# Patient Record
Sex: Female | Born: 1945 | Race: White | Hispanic: No | Marital: Married | Smoking: Never smoker
Health system: Southern US, Community
[De-identification: ages and names within clinical notes are randomized; demographics above are authoritative.]

---

## 2004-02-21 ENCOUNTER — Encounter: Admission: RE | Admit: 2004-02-21 | Discharge: 2004-02-21 | Payer: Self-pay | Admitting: Family Medicine

## 2004-12-08 IMAGING — US US ABDOMEN COMPLETE
1 series · 14 of 25 positions shown · non-contrast
Comparison: none

CLINICAL DATA: Mid epigastric and lower abdominal pain.  Back pain.  Nausea.
 ABDOMINAL ULTRASOUND
 There is no evidence of gallstones or gallbladder wall thickening. There is no evidence of biliary ductal dilatation. The liver is within normal limits in echogenicity and no focal parenchymal lesions are identified. The visualized portion of the pancreas is unremarkable in appearance.

[Series 1: unknown · 0.27mm/px · 14 of 59 slices shown]
[im 1/59]
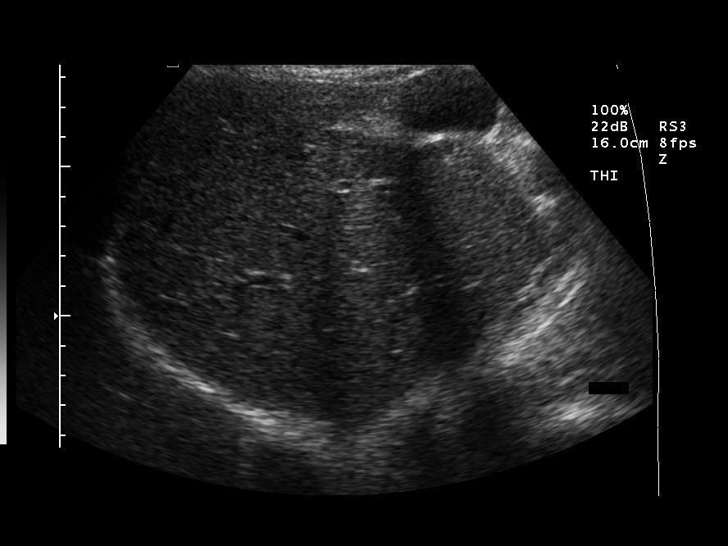
[im 5/59]
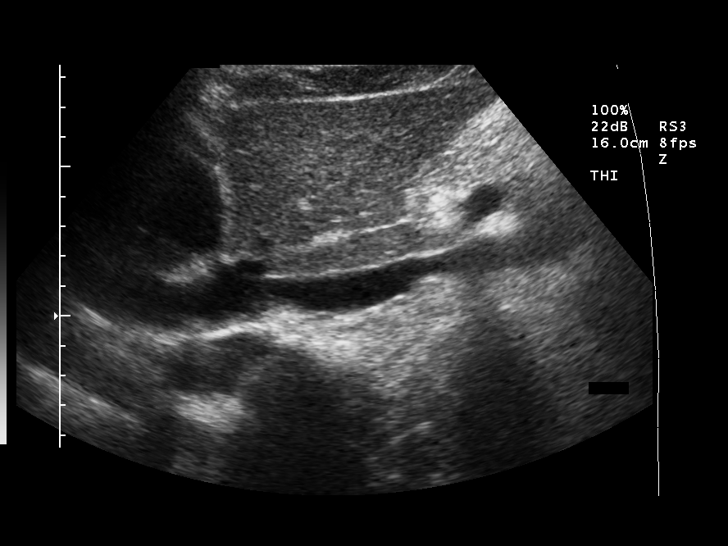
[im 10/59]
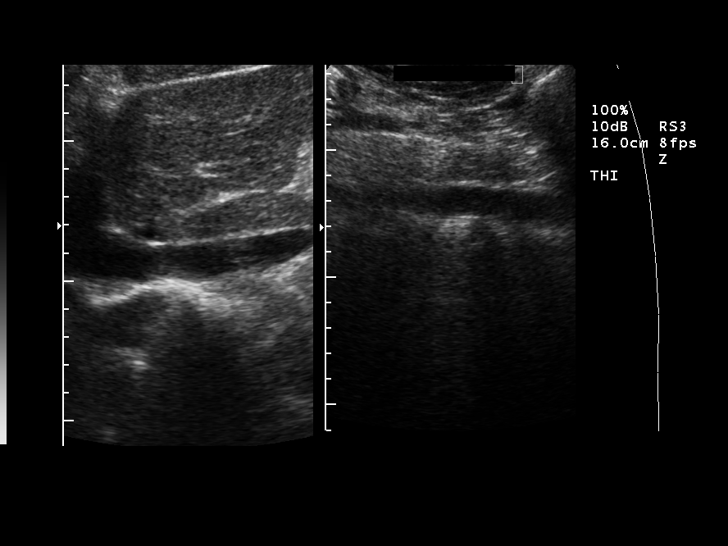
[im 15/59]
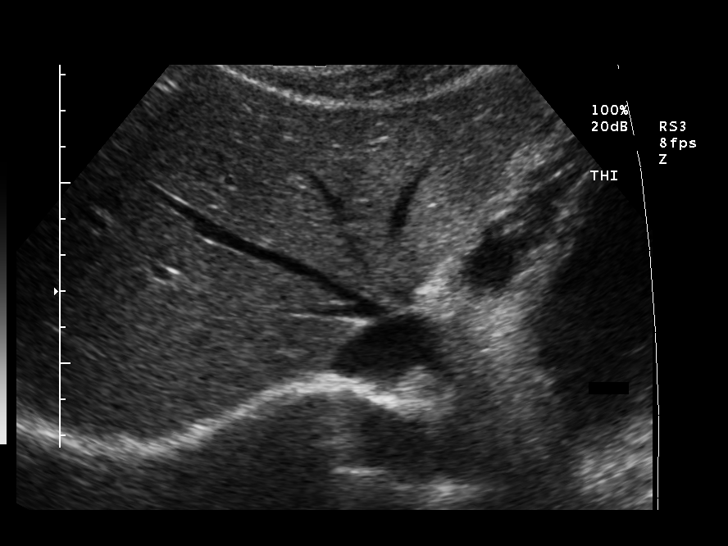
[im 20/59]
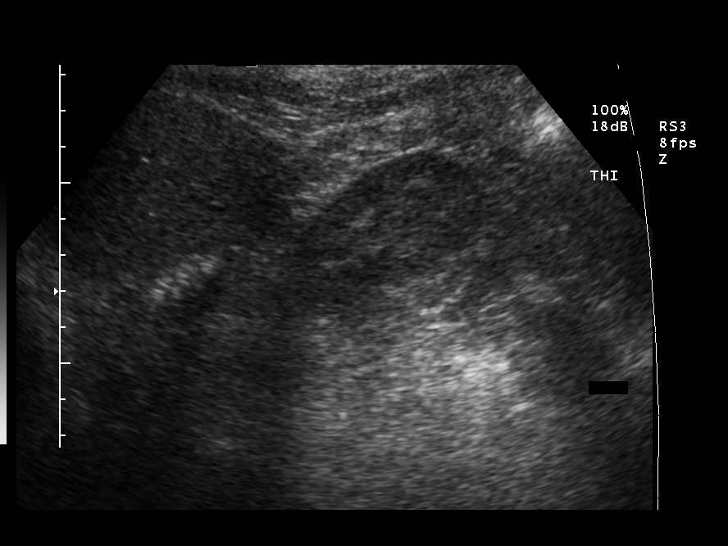
[im 22/59]
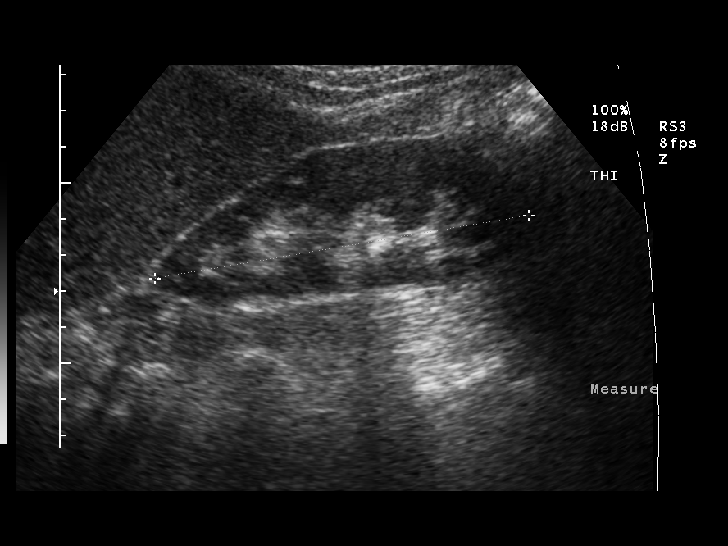
[im 27/59]
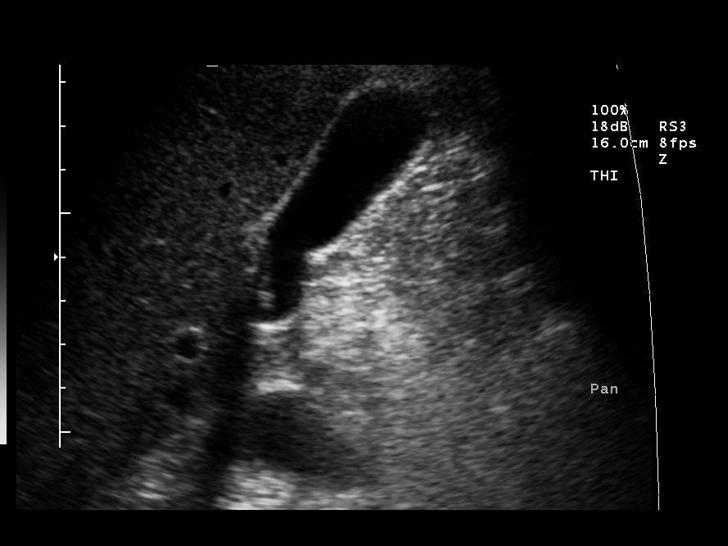
[im 32/59]
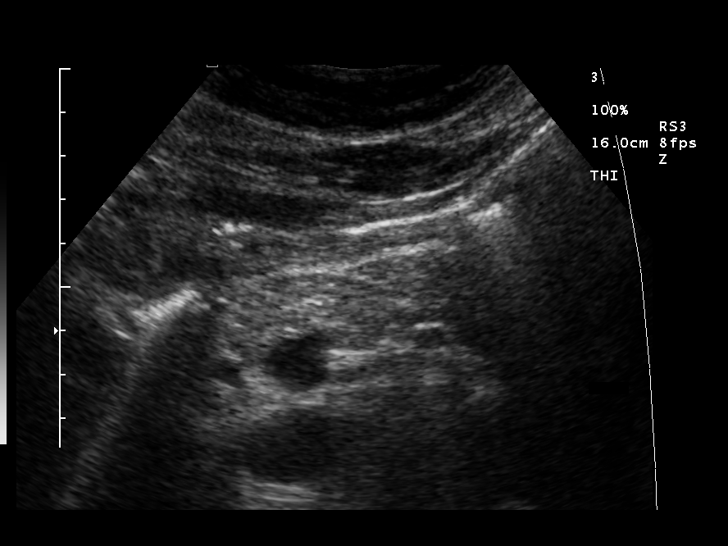
[im 37/59]
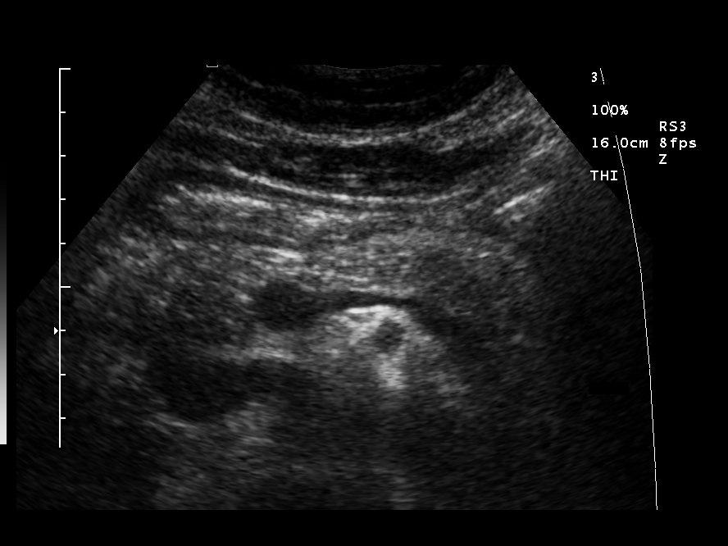
[im 39/59]
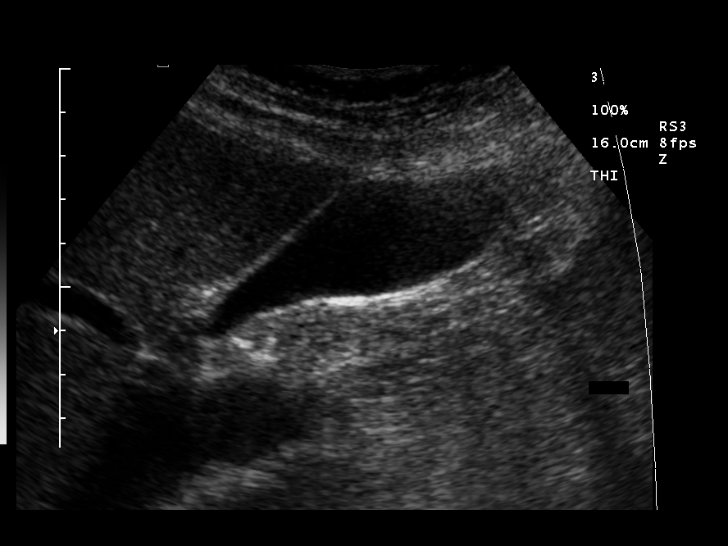
[im 44/59]
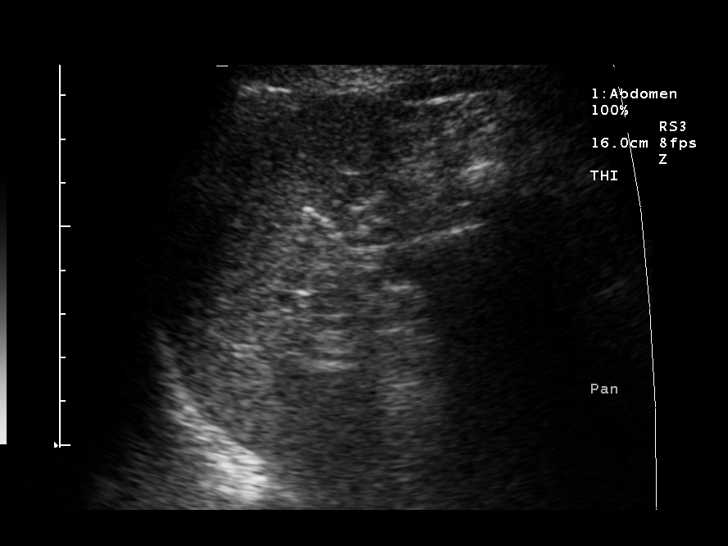
[im 49/59]
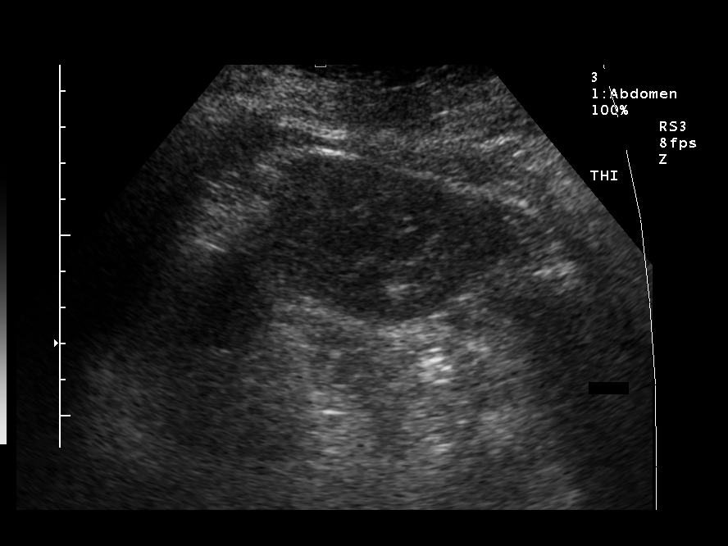
[im 54/59]
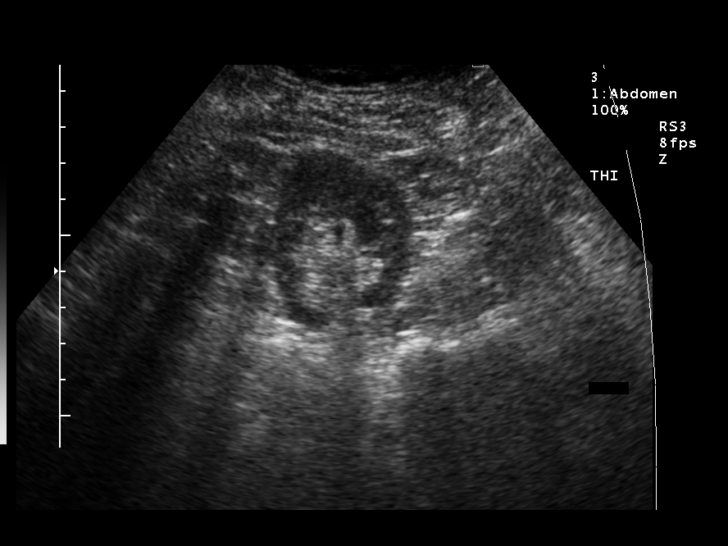
[im 59/59]
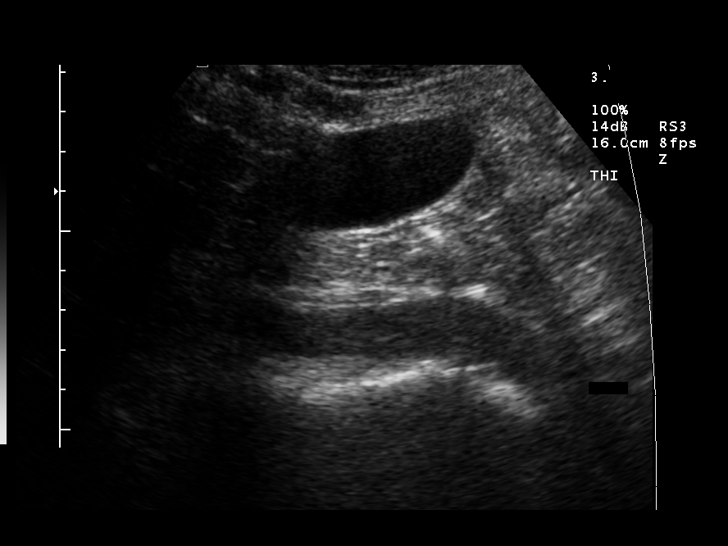

[14 of 25 positions shown; findings below may reference images not displayed]

The kidneys are within normal limits in size and echogenicity and there is no evidence of masses or hydronephrosis.  There is no evidence of splenomegaly or abdominal aortic aneurysm.  Images of the inferior vena cava are unremarkable, and there is no evidence of ascites.

 IMPRESSION
 Negative abdominal ultrasound.

## 2006-04-19 ENCOUNTER — Other Ambulatory Visit: Admission: RE | Admit: 2006-04-19 | Discharge: 2006-04-19 | Payer: Self-pay | Admitting: Obstetrics and Gynecology

## 2014-10-01 DIAGNOSIS — L820 Inflamed seborrheic keratosis: Secondary | ICD-10-CM | POA: Diagnosis not present

## 2014-10-01 DIAGNOSIS — D485 Neoplasm of uncertain behavior of skin: Secondary | ICD-10-CM | POA: Diagnosis not present

## 2014-10-01 DIAGNOSIS — L570 Actinic keratosis: Secondary | ICD-10-CM | POA: Diagnosis not present

## 2014-12-13 DIAGNOSIS — Z1231 Encounter for screening mammogram for malignant neoplasm of breast: Secondary | ICD-10-CM | POA: Diagnosis not present

## 2015-01-07 DIAGNOSIS — L905 Scar conditions and fibrosis of skin: Secondary | ICD-10-CM | POA: Diagnosis not present

## 2015-01-07 DIAGNOSIS — M810 Age-related osteoporosis without current pathological fracture: Secondary | ICD-10-CM | POA: Diagnosis not present

## 2015-02-12 DIAGNOSIS — E780 Pure hypercholesterolemia: Secondary | ICD-10-CM | POA: Diagnosis not present

## 2015-02-12 DIAGNOSIS — M810 Age-related osteoporosis without current pathological fracture: Secondary | ICD-10-CM | POA: Diagnosis not present

## 2015-02-12 DIAGNOSIS — M545 Low back pain: Secondary | ICD-10-CM | POA: Diagnosis not present

## 2015-02-12 DIAGNOSIS — M25552 Pain in left hip: Secondary | ICD-10-CM | POA: Diagnosis not present

## 2015-02-12 DIAGNOSIS — M25551 Pain in right hip: Secondary | ICD-10-CM | POA: Diagnosis not present

## 2015-03-03 ENCOUNTER — Encounter: Payer: Self-pay | Admitting: Family Medicine

## 2015-03-03 ENCOUNTER — Ambulatory Visit (INDEPENDENT_AMBULATORY_CARE_PROVIDER_SITE_OTHER): Payer: Medicare Other | Admitting: Family Medicine

## 2015-03-03 ENCOUNTER — Encounter (INDEPENDENT_AMBULATORY_CARE_PROVIDER_SITE_OTHER): Payer: Self-pay

## 2015-03-03 VITALS — BP 145/93 | Ht 66.0 in | Wt 155.0 lb

## 2015-03-03 DIAGNOSIS — M25551 Pain in right hip: Secondary | ICD-10-CM

## 2015-03-03 NOTE — Progress Notes (Signed)
  Maria Beck - 69 y.o. female MRN [id number]  Date of birth: Jun 28, 1946   Maria Beck is a 69 y.o. female who presents today for bilateral hip pain with more significant right hip pain. The pain has been occuring for a few months. The right hip pain is worse in the morning when she is trying to stand up.  The pain is occuring in the right groin and the anterior quad. The pain is sharp in nature. She denies any prior injury or fall to her right hip. She denies any prior surgery. She denies any paresthesia, fever, chills or night sweats. She is not currently taking anything for pain. She has a history of a broken disc in her back for which she has done physical therapy for.  PMHx - reviewed.  Contributory factors include: osteoporosis  Medications - Reclast   ROS Per HPI   Exam:  Filed Vitals:   03/03/15 1342  BP: 145/93   Gen: NAD Cardiorespiratory - Normal respiratory effort/rate.  Hip:  Appearance: Symmetric with no obvious deformities Palpation: no tenderness of greater trochanter, no TTP of SI joint  Rotation Reduced: normal internal and external ROM. Internal ROM reproduced pain in the right hip  FADIR: normal  FABER: normal  Pos log roll on right  Neuro: Strength hip flexion 5/5, hip adduction 4/5 on right, hip adduction 5/5 on left,  knee extension 5/5, knee flexion 5/5, dorsiflexion 5/5, plantar flexion 5/5 Reflexes: patella 2/2 Bilateral  Achilles 2/2 Bilateral Sensation to light touch intact: yes Neurovascularly intact Normal gait.   Imaging:  02/12/15 Hip (with pelvis): showing osteopenia and degenerative changes of the hip and SI joint. Scanned into chart.

## 2015-03-03 NOTE — Assessment & Plan Note (Addendum)
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  Exam revealing for weak hip abduction on right. - she will continue taking aleve for pain  - if pain continues to worse then may need to consider hip injections vs hip replacement down the right.  - f/u PRN

## 2015-03-04 NOTE — Progress Notes (Signed)
Patient ID: Maria Beck, female   DOB: [birth year], 69 y.o.   MRN: [id number] [place] Attending Note: I have seen and examined this patient. I have discussed this patient with the resident and reviewed the assessment and plan as documented above. I agree with the resident's findings and plan. She was able to go back to the radiology department and get her disc and bring it back to the clinic today. She had a 1 view bilateral hip pelvis which we looked at. The right acetabulum appears to have a small amount of cystic change with it so I do think were dealing with some osteoarthritis. She does not want to consider aggressive intervention such as hip replacement or pain medicine. She does get some relief from over-the-counter NSAIDs so we talked about that our recommend she use to over-the-counter Aleve daily and slow she's not having side effects. If this no longer treats her symptoms at some point she'll come back and we will discuss other options which are limited mostly to a trial of hip cord wrist area injection for evaluation for total hip replacement. I do wonder if she may be at risk for avascular necrosis but there is certainly no sign of that on her current film.

## 2015-03-05 ENCOUNTER — Encounter: Payer: Self-pay | Admitting: Sports Medicine

## 2015-03-10 DIAGNOSIS — M160 Bilateral primary osteoarthritis of hip: Secondary | ICD-10-CM | POA: Diagnosis not present

## 2015-03-10 DIAGNOSIS — Z23 Encounter for immunization: Secondary | ICD-10-CM | POA: Diagnosis not present

## 2015-05-01 DIAGNOSIS — M160 Bilateral primary osteoarthritis of hip: Secondary | ICD-10-CM | POA: Diagnosis not present

## 2015-05-01 DIAGNOSIS — Z23 Encounter for immunization: Secondary | ICD-10-CM | POA: Diagnosis not present

## 2015-05-01 DIAGNOSIS — M549 Dorsalgia, unspecified: Secondary | ICD-10-CM | POA: Diagnosis not present

## 2015-06-24 DIAGNOSIS — M810 Age-related osteoporosis without current pathological fracture: Secondary | ICD-10-CM | POA: Diagnosis not present

## 2015-06-24 DIAGNOSIS — M159 Polyosteoarthritis, unspecified: Secondary | ICD-10-CM | POA: Diagnosis not present

## 2015-06-24 DIAGNOSIS — R002 Palpitations: Secondary | ICD-10-CM | POA: Diagnosis not present

## 2015-06-24 DIAGNOSIS — R030 Elevated blood-pressure reading, without diagnosis of hypertension: Secondary | ICD-10-CM | POA: Diagnosis not present

## 2015-07-07 DIAGNOSIS — E7800 Pure hypercholesterolemia, unspecified: Secondary | ICD-10-CM | POA: Diagnosis not present

## 2015-07-07 DIAGNOSIS — M810 Age-related osteoporosis without current pathological fracture: Secondary | ICD-10-CM | POA: Diagnosis not present

## 2015-09-01 DIAGNOSIS — I10 Essential (primary) hypertension: Secondary | ICD-10-CM | POA: Diagnosis not present

## 2015-09-01 DIAGNOSIS — M1990 Unspecified osteoarthritis, unspecified site: Secondary | ICD-10-CM | POA: Diagnosis not present

## 2015-09-29 DIAGNOSIS — M810 Age-related osteoporosis without current pathological fracture: Secondary | ICD-10-CM | POA: Diagnosis not present

## 2015-09-29 DIAGNOSIS — M160 Bilateral primary osteoarthritis of hip: Secondary | ICD-10-CM | POA: Diagnosis not present

## 2015-10-06 DIAGNOSIS — M810 Age-related osteoporosis without current pathological fracture: Secondary | ICD-10-CM | POA: Diagnosis not present

## 2015-10-06 DIAGNOSIS — I10 Essential (primary) hypertension: Secondary | ICD-10-CM | POA: Diagnosis not present

## 2015-11-03 DIAGNOSIS — J019 Acute sinusitis, unspecified: Secondary | ICD-10-CM | POA: Diagnosis not present

## 2015-11-03 DIAGNOSIS — R0602 Shortness of breath: Secondary | ICD-10-CM | POA: Diagnosis not present

## 2015-11-03 DIAGNOSIS — J029 Acute pharyngitis, unspecified: Secondary | ICD-10-CM | POA: Diagnosis not present

## 2015-11-03 DIAGNOSIS — R0600 Dyspnea, unspecified: Secondary | ICD-10-CM | POA: Diagnosis not present

## 2015-11-03 DIAGNOSIS — J209 Acute bronchitis, unspecified: Secondary | ICD-10-CM | POA: Diagnosis not present

## 2015-11-05 DIAGNOSIS — J189 Pneumonia, unspecified organism: Secondary | ICD-10-CM | POA: Diagnosis not present

## 2015-11-05 DIAGNOSIS — J209 Acute bronchitis, unspecified: Secondary | ICD-10-CM | POA: Diagnosis not present

## 2015-11-05 DIAGNOSIS — J019 Acute sinusitis, unspecified: Secondary | ICD-10-CM | POA: Diagnosis not present

## 2015-11-07 DIAGNOSIS — J019 Acute sinusitis, unspecified: Secondary | ICD-10-CM | POA: Diagnosis not present

## 2015-11-07 DIAGNOSIS — J189 Pneumonia, unspecified organism: Secondary | ICD-10-CM | POA: Diagnosis not present

## 2015-12-15 DIAGNOSIS — Z1231 Encounter for screening mammogram for malignant neoplasm of breast: Secondary | ICD-10-CM | POA: Diagnosis not present

## 2016-01-05 DIAGNOSIS — M810 Age-related osteoporosis without current pathological fracture: Secondary | ICD-10-CM | POA: Diagnosis not present

## 2016-01-05 DIAGNOSIS — M160 Bilateral primary osteoarthritis of hip: Secondary | ICD-10-CM | POA: Diagnosis not present

## 2016-01-05 DIAGNOSIS — R262 Difficulty in walking, not elsewhere classified: Secondary | ICD-10-CM | POA: Diagnosis not present

## 2016-01-08 DIAGNOSIS — M160 Bilateral primary osteoarthritis of hip: Secondary | ICD-10-CM | POA: Diagnosis not present

## 2016-01-08 DIAGNOSIS — R262 Difficulty in walking, not elsewhere classified: Secondary | ICD-10-CM | POA: Diagnosis not present

## 2016-01-08 DIAGNOSIS — M810 Age-related osteoporosis without current pathological fracture: Secondary | ICD-10-CM | POA: Diagnosis not present

## 2016-01-12 DIAGNOSIS — R262 Difficulty in walking, not elsewhere classified: Secondary | ICD-10-CM | POA: Diagnosis not present

## 2016-01-12 DIAGNOSIS — M160 Bilateral primary osteoarthritis of hip: Secondary | ICD-10-CM | POA: Diagnosis not present

## 2016-01-12 DIAGNOSIS — M810 Age-related osteoporosis without current pathological fracture: Secondary | ICD-10-CM | POA: Diagnosis not present

## 2016-01-14 DIAGNOSIS — M160 Bilateral primary osteoarthritis of hip: Secondary | ICD-10-CM | POA: Diagnosis not present

## 2016-01-14 DIAGNOSIS — R262 Difficulty in walking, not elsewhere classified: Secondary | ICD-10-CM | POA: Diagnosis not present

## 2016-01-14 DIAGNOSIS — M810 Age-related osteoporosis without current pathological fracture: Secondary | ICD-10-CM | POA: Diagnosis not present

## 2016-01-19 DIAGNOSIS — M160 Bilateral primary osteoarthritis of hip: Secondary | ICD-10-CM | POA: Diagnosis not present

## 2016-01-19 DIAGNOSIS — M810 Age-related osteoporosis without current pathological fracture: Secondary | ICD-10-CM | POA: Diagnosis not present

## 2016-01-19 DIAGNOSIS — R262 Difficulty in walking, not elsewhere classified: Secondary | ICD-10-CM | POA: Diagnosis not present

## 2016-01-29 DIAGNOSIS — M25551 Pain in right hip: Secondary | ICD-10-CM | POA: Diagnosis not present

## 2016-01-29 DIAGNOSIS — M810 Age-related osteoporosis without current pathological fracture: Secondary | ICD-10-CM | POA: Diagnosis not present

## 2016-01-29 DIAGNOSIS — M160 Bilateral primary osteoarthritis of hip: Secondary | ICD-10-CM | POA: Diagnosis not present

## 2016-04-05 DIAGNOSIS — M4856XD Collapsed vertebra, not elsewhere classified, lumbar region, subsequent encounter for fracture with routine healing: Secondary | ICD-10-CM | POA: Diagnosis not present

## 2016-04-05 DIAGNOSIS — M1990 Unspecified osteoarthritis, unspecified site: Secondary | ICD-10-CM | POA: Diagnosis not present

## 2016-04-05 DIAGNOSIS — M25851 Other specified joint disorders, right hip: Secondary | ICD-10-CM | POA: Diagnosis not present

## 2016-04-05 DIAGNOSIS — M545 Low back pain: Secondary | ICD-10-CM | POA: Diagnosis not present

## 2016-04-05 DIAGNOSIS — M25551 Pain in right hip: Secondary | ICD-10-CM | POA: Diagnosis not present

## 2016-04-28 DIAGNOSIS — R079 Chest pain, unspecified: Secondary | ICD-10-CM | POA: Diagnosis not present

## 2016-04-28 DIAGNOSIS — R9431 Abnormal electrocardiogram [ECG] [EKG]: Secondary | ICD-10-CM | POA: Diagnosis not present

## 2016-04-28 DIAGNOSIS — I10 Essential (primary) hypertension: Secondary | ICD-10-CM | POA: Diagnosis not present

## 2016-05-07 DIAGNOSIS — R9431 Abnormal electrocardiogram [ECG] [EKG]: Secondary | ICD-10-CM | POA: Diagnosis not present

## 2016-05-07 DIAGNOSIS — R0602 Shortness of breath: Secondary | ICD-10-CM | POA: Diagnosis not present

## 2016-05-07 DIAGNOSIS — I10 Essential (primary) hypertension: Secondary | ICD-10-CM | POA: Diagnosis not present

## 2016-05-07 DIAGNOSIS — R079 Chest pain, unspecified: Secondary | ICD-10-CM | POA: Diagnosis not present

## 2016-05-25 DIAGNOSIS — R079 Chest pain, unspecified: Secondary | ICD-10-CM | POA: Diagnosis not present

## 2016-05-25 DIAGNOSIS — I10 Essential (primary) hypertension: Secondary | ICD-10-CM | POA: Diagnosis not present

## 2016-05-25 DIAGNOSIS — R9431 Abnormal electrocardiogram [ECG] [EKG]: Secondary | ICD-10-CM | POA: Diagnosis not present

## 2016-05-25 DIAGNOSIS — I77810 Thoracic aortic ectasia: Secondary | ICD-10-CM | POA: Diagnosis not present

## 2016-05-27 DIAGNOSIS — M810 Age-related osteoporosis without current pathological fracture: Secondary | ICD-10-CM | POA: Diagnosis not present

## 2016-05-27 DIAGNOSIS — M160 Bilateral primary osteoarthritis of hip: Secondary | ICD-10-CM | POA: Diagnosis not present

## 2016-05-27 DIAGNOSIS — M25551 Pain in right hip: Secondary | ICD-10-CM | POA: Diagnosis not present

## 2016-07-12 DIAGNOSIS — M810 Age-related osteoporosis without current pathological fracture: Secondary | ICD-10-CM | POA: Diagnosis not present

## 2016-07-12 DIAGNOSIS — I10 Essential (primary) hypertension: Secondary | ICD-10-CM | POA: Diagnosis not present

## 2016-07-12 DIAGNOSIS — Z79899 Other long term (current) drug therapy: Secondary | ICD-10-CM | POA: Diagnosis not present

## 2016-09-28 DIAGNOSIS — M810 Age-related osteoporosis without current pathological fracture: Secondary | ICD-10-CM | POA: Diagnosis not present

## 2016-09-28 DIAGNOSIS — Z79899 Other long term (current) drug therapy: Secondary | ICD-10-CM | POA: Diagnosis not present

## 2016-09-28 DIAGNOSIS — Z789 Other specified health status: Secondary | ICD-10-CM | POA: Diagnosis not present

## 2016-09-28 DIAGNOSIS — M1611 Unilateral primary osteoarthritis, right hip: Secondary | ICD-10-CM | POA: Diagnosis not present

## 2016-10-18 DIAGNOSIS — H2513 Age-related nuclear cataract, bilateral: Secondary | ICD-10-CM | POA: Diagnosis not present

## 2016-10-26 DIAGNOSIS — I10 Essential (primary) hypertension: Secondary | ICD-10-CM | POA: Diagnosis not present

## 2016-10-26 DIAGNOSIS — R072 Precordial pain: Secondary | ICD-10-CM | POA: Diagnosis not present

## 2016-10-26 DIAGNOSIS — R002 Palpitations: Secondary | ICD-10-CM | POA: Diagnosis not present

## 2016-10-26 DIAGNOSIS — R9431 Abnormal electrocardiogram [ECG] [EKG]: Secondary | ICD-10-CM | POA: Diagnosis not present

## 2016-10-26 DIAGNOSIS — E782 Mixed hyperlipidemia: Secondary | ICD-10-CM | POA: Diagnosis not present

## 2016-10-26 DIAGNOSIS — M069 Rheumatoid arthritis, unspecified: Secondary | ICD-10-CM | POA: Diagnosis not present

## 2016-10-26 DIAGNOSIS — I77810 Thoracic aortic ectasia: Secondary | ICD-10-CM | POA: Diagnosis not present

## 2016-12-15 DIAGNOSIS — Z1231 Encounter for screening mammogram for malignant neoplasm of breast: Secondary | ICD-10-CM | POA: Diagnosis not present

## 2016-12-16 DIAGNOSIS — M4856XG Collapsed vertebra, not elsewhere classified, lumbar region, subsequent encounter for fracture with delayed healing: Secondary | ICD-10-CM | POA: Diagnosis not present

## 2016-12-16 DIAGNOSIS — M549 Dorsalgia, unspecified: Secondary | ICD-10-CM | POA: Diagnosis not present

## 2017-02-08 DIAGNOSIS — M1611 Unilateral primary osteoarthritis, right hip: Secondary | ICD-10-CM | POA: Diagnosis not present

## 2017-02-08 DIAGNOSIS — M160 Bilateral primary osteoarthritis of hip: Secondary | ICD-10-CM | POA: Diagnosis not present

## 2017-02-08 DIAGNOSIS — M810 Age-related osteoporosis without current pathological fracture: Secondary | ICD-10-CM | POA: Diagnosis not present

## 2017-02-18 DIAGNOSIS — M810 Age-related osteoporosis without current pathological fracture: Secondary | ICD-10-CM | POA: Diagnosis not present

## 2017-02-18 DIAGNOSIS — M159 Polyosteoarthritis, unspecified: Secondary | ICD-10-CM | POA: Diagnosis not present

## 2017-02-18 DIAGNOSIS — I10 Essential (primary) hypertension: Secondary | ICD-10-CM | POA: Diagnosis not present

## 2017-02-18 DIAGNOSIS — M25551 Pain in right hip: Secondary | ICD-10-CM | POA: Diagnosis not present

## 2017-02-18 DIAGNOSIS — E559 Vitamin D deficiency, unspecified: Secondary | ICD-10-CM | POA: Diagnosis not present

## 2017-02-18 DIAGNOSIS — E7800 Pure hypercholesterolemia, unspecified: Secondary | ICD-10-CM | POA: Diagnosis not present

## 2017-03-21 DIAGNOSIS — E7800 Pure hypercholesterolemia, unspecified: Secondary | ICD-10-CM | POA: Diagnosis not present

## 2017-04-04 DIAGNOSIS — H2513 Age-related nuclear cataract, bilateral: Secondary | ICD-10-CM | POA: Diagnosis not present

## 2017-04-04 DIAGNOSIS — H2512 Age-related nuclear cataract, left eye: Secondary | ICD-10-CM | POA: Diagnosis not present

## 2017-05-02 DIAGNOSIS — H2512 Age-related nuclear cataract, left eye: Secondary | ICD-10-CM | POA: Diagnosis not present

## 2017-06-01 DIAGNOSIS — M069 Rheumatoid arthritis, unspecified: Secondary | ICD-10-CM | POA: Diagnosis not present

## 2017-06-01 DIAGNOSIS — I77810 Thoracic aortic ectasia: Secondary | ICD-10-CM | POA: Diagnosis not present

## 2017-06-01 DIAGNOSIS — E782 Mixed hyperlipidemia: Secondary | ICD-10-CM | POA: Diagnosis not present

## 2017-06-01 DIAGNOSIS — R9431 Abnormal electrocardiogram [ECG] [EKG]: Secondary | ICD-10-CM | POA: Diagnosis not present

## 2017-06-01 DIAGNOSIS — R002 Palpitations: Secondary | ICD-10-CM | POA: Diagnosis not present

## 2017-06-01 DIAGNOSIS — R072 Precordial pain: Secondary | ICD-10-CM | POA: Diagnosis not present

## 2017-06-01 DIAGNOSIS — I10 Essential (primary) hypertension: Secondary | ICD-10-CM | POA: Diagnosis not present

## 2017-07-12 DIAGNOSIS — D1801 Hemangioma of skin and subcutaneous tissue: Secondary | ICD-10-CM | POA: Diagnosis not present

## 2017-07-12 DIAGNOSIS — L820 Inflamed seborrheic keratosis: Secondary | ICD-10-CM | POA: Diagnosis not present

## 2017-07-12 DIAGNOSIS — L814 Other melanin hyperpigmentation: Secondary | ICD-10-CM | POA: Diagnosis not present

## 2017-07-12 DIAGNOSIS — L821 Other seborrheic keratosis: Secondary | ICD-10-CM | POA: Diagnosis not present

## 2017-07-20 DIAGNOSIS — H43812 Vitreous degeneration, left eye: Secondary | ICD-10-CM | POA: Diagnosis not present

## 2017-07-29 DIAGNOSIS — S46911S Strain of unspecified muscle, fascia and tendon at shoulder and upper arm level, right arm, sequela: Secondary | ICD-10-CM | POA: Diagnosis not present

## 2017-07-29 DIAGNOSIS — Z0131 Encounter for examination of blood pressure with abnormal findings: Secondary | ICD-10-CM | POA: Diagnosis not present

## 2017-08-09 DIAGNOSIS — H43812 Vitreous degeneration, left eye: Secondary | ICD-10-CM | POA: Diagnosis not present

## 2017-08-09 DIAGNOSIS — Z961 Presence of intraocular lens: Secondary | ICD-10-CM | POA: Diagnosis not present

## 2017-08-18 DIAGNOSIS — M810 Age-related osteoporosis without current pathological fracture: Secondary | ICD-10-CM | POA: Diagnosis not present

## 2017-08-18 DIAGNOSIS — H547 Unspecified visual loss: Secondary | ICD-10-CM | POA: Diagnosis not present

## 2017-08-18 DIAGNOSIS — M160 Bilateral primary osteoarthritis of hip: Secondary | ICD-10-CM | POA: Diagnosis not present

## 2017-09-05 DIAGNOSIS — Z9842 Cataract extraction status, left eye: Secondary | ICD-10-CM | POA: Diagnosis not present

## 2017-09-05 DIAGNOSIS — H43812 Vitreous degeneration, left eye: Secondary | ICD-10-CM | POA: Diagnosis not present

## 2017-09-05 DIAGNOSIS — H25811 Combined forms of age-related cataract, right eye: Secondary | ICD-10-CM | POA: Diagnosis not present

## 2017-09-05 DIAGNOSIS — Z961 Presence of intraocular lens: Secondary | ICD-10-CM | POA: Diagnosis not present

## 2017-10-19 DIAGNOSIS — Z961 Presence of intraocular lens: Secondary | ICD-10-CM | POA: Diagnosis not present

## 2017-10-19 DIAGNOSIS — H2511 Age-related nuclear cataract, right eye: Secondary | ICD-10-CM | POA: Diagnosis not present

## 2017-10-19 DIAGNOSIS — H25011 Cortical age-related cataract, right eye: Secondary | ICD-10-CM | POA: Diagnosis not present

## 2017-10-19 DIAGNOSIS — H43392 Other vitreous opacities, left eye: Secondary | ICD-10-CM | POA: Diagnosis not present

## 2017-11-23 DIAGNOSIS — I77810 Thoracic aortic ectasia: Secondary | ICD-10-CM | POA: Diagnosis not present

## 2017-11-23 DIAGNOSIS — I10 Essential (primary) hypertension: Secondary | ICD-10-CM | POA: Diagnosis not present

## 2017-11-29 DIAGNOSIS — E782 Mixed hyperlipidemia: Secondary | ICD-10-CM | POA: Diagnosis not present

## 2017-11-29 DIAGNOSIS — I10 Essential (primary) hypertension: Secondary | ICD-10-CM | POA: Diagnosis not present

## 2017-11-29 DIAGNOSIS — I77810 Thoracic aortic ectasia: Secondary | ICD-10-CM | POA: Diagnosis not present

## 2017-11-29 DIAGNOSIS — R002 Palpitations: Secondary | ICD-10-CM | POA: Diagnosis not present

## 2017-12-16 DIAGNOSIS — Z1231 Encounter for screening mammogram for malignant neoplasm of breast: Secondary | ICD-10-CM | POA: Diagnosis not present

## 2018-01-23 DIAGNOSIS — H43392 Other vitreous opacities, left eye: Secondary | ICD-10-CM | POA: Diagnosis not present

## 2018-02-06 DIAGNOSIS — B079 Viral wart, unspecified: Secondary | ICD-10-CM | POA: Diagnosis not present

## 2018-02-06 DIAGNOSIS — D485 Neoplasm of uncertain behavior of skin: Secondary | ICD-10-CM | POA: Diagnosis not present

## 2018-02-06 DIAGNOSIS — L821 Other seborrheic keratosis: Secondary | ICD-10-CM | POA: Diagnosis not present

## 2018-04-07 DIAGNOSIS — E782 Mixed hyperlipidemia: Secondary | ICD-10-CM | POA: Diagnosis not present

## 2018-04-07 DIAGNOSIS — M810 Age-related osteoporosis without current pathological fracture: Secondary | ICD-10-CM | POA: Diagnosis not present

## 2018-04-07 DIAGNOSIS — Z1211 Encounter for screening for malignant neoplasm of colon: Secondary | ICD-10-CM | POA: Diagnosis not present

## 2018-04-07 DIAGNOSIS — E559 Vitamin D deficiency, unspecified: Secondary | ICD-10-CM | POA: Diagnosis not present

## 2018-04-07 DIAGNOSIS — E538 Deficiency of other specified B group vitamins: Secondary | ICD-10-CM | POA: Diagnosis not present

## 2018-04-07 DIAGNOSIS — Z209 Contact with and (suspected) exposure to unspecified communicable disease: Secondary | ICD-10-CM | POA: Diagnosis not present

## 2018-04-07 DIAGNOSIS — Z23 Encounter for immunization: Secondary | ICD-10-CM | POA: Diagnosis not present

## 2018-04-07 DIAGNOSIS — R300 Dysuria: Secondary | ICD-10-CM | POA: Diagnosis not present

## 2018-04-07 DIAGNOSIS — Z0000 Encounter for general adult medical examination without abnormal findings: Secondary | ICD-10-CM | POA: Diagnosis not present

## 2018-04-13 DIAGNOSIS — H43392 Other vitreous opacities, left eye: Secondary | ICD-10-CM | POA: Diagnosis not present

## 2018-04-13 DIAGNOSIS — H2511 Age-related nuclear cataract, right eye: Secondary | ICD-10-CM | POA: Diagnosis not present

## 2018-04-13 DIAGNOSIS — H43312 Vitreous membranes and strands, left eye: Secondary | ICD-10-CM | POA: Diagnosis not present

## 2018-04-13 DIAGNOSIS — Z961 Presence of intraocular lens: Secondary | ICD-10-CM | POA: Diagnosis not present

## 2018-05-10 DIAGNOSIS — H43312 Vitreous membranes and strands, left eye: Secondary | ICD-10-CM | POA: Diagnosis not present

## 2018-05-22 DIAGNOSIS — Z09 Encounter for follow-up examination after completed treatment for conditions other than malignant neoplasm: Secondary | ICD-10-CM | POA: Diagnosis not present

## 2018-05-22 DIAGNOSIS — H43312 Vitreous membranes and strands, left eye: Secondary | ICD-10-CM | POA: Diagnosis not present

## 2020-09-15 ENCOUNTER — Encounter (INDEPENDENT_AMBULATORY_CARE_PROVIDER_SITE_OTHER): Payer: Medicare Other | Admitting: Ophthalmology

## 2020-09-24 ENCOUNTER — Encounter (INDEPENDENT_AMBULATORY_CARE_PROVIDER_SITE_OTHER): Payer: Self-pay | Admitting: Ophthalmology

## 2020-09-24 ENCOUNTER — Encounter (INDEPENDENT_AMBULATORY_CARE_PROVIDER_SITE_OTHER): Payer: Self-pay

## 2020-09-24 ENCOUNTER — Ambulatory Visit (INDEPENDENT_AMBULATORY_CARE_PROVIDER_SITE_OTHER): Payer: Medicare Other | Admitting: Ophthalmology

## 2020-09-24 ENCOUNTER — Other Ambulatory Visit: Payer: Self-pay

## 2020-09-24 DIAGNOSIS — H43312 Vitreous membranes and strands, left eye: Secondary | ICD-10-CM | POA: Diagnosis not present

## 2020-09-24 DIAGNOSIS — H43811 Vitreous degeneration, right eye: Secondary | ICD-10-CM | POA: Diagnosis not present

## 2020-09-24 DIAGNOSIS — Z9889 Other specified postprocedural states: Secondary | ICD-10-CM

## 2020-09-24 DIAGNOSIS — H43311 Vitreous membranes and strands, right eye: Secondary | ICD-10-CM

## 2020-09-24 HISTORY — DX: Vitreous membranes and strands, left eye: H43.312

## 2020-09-24 NOTE — Progress Notes (Signed)
09/24/2020     CHIEF COMPLAINT Patient presents for Retina Follow Up (2 YR FU OU. Pt is PC IOL OD SX 02/26/20 [place], and YAG CAP OD 09/17/20, and YAG CAP OS scheduled 10/22/20.////Pt reports vision slightly improved. Pt reports FB sensation OS, OD good. Some pain, uncomfortable feeling OS. Some dark floaters that come and go OS, and large transparent floaters OD. )   HISTORY OF PRESENT ILLNESS: Maria Beck is a 75 y.o. female who presents to the clinic today for:   HPI    Retina Follow Up    Patient presents with  Other.  In both eyes.  This started 2 years ago.  Duration of 2 years.  Since onset it is stable. Additional comments: 2 YR FU OU. Pt is PC IOL OD SX 02/26/20 [place], and YAG CAP OD 09/17/20, and YAG CAP OS scheduled 10/22/20.    Pt reports vision slightly improved. Pt reports FB sensation OS, OD good. Some pain, uncomfortable feeling OS. Some dark floaters that come and go OS, and large transparent floaters OD.        Last edited by Nichola Sizer D on 09/24/2020 10:27 AM. (History)      Referring physician: Deland Pretty, MD [place],  [place] [address]  HISTORICAL INFORMATION:   Selected notes from the [facility]: No current outpatient medications on file. (Ophthalmic Drugs)   No current facility-administered medications for this visit. (Ophthalmic Drugs)   No current outpatient medications on file. (Other)   No current facility-administered medications for this visit. (Other)      REVIEW OF SYSTEMS:    ALLERGIES No Known Allergies  PAST MEDICAL HISTORY Past Medical History:  Diagnosis Date  . Vitreous membranes and strands, left eye 09/24/2020   Resected October 2019 with improved acuity   History reviewed. No pertinent surgical history.  FAMILY HISTORY History reviewed. No pertinent family history.  SOCIAL HISTORY Social History   Tobacco Use  . Smoking status:  Never Smoker  . Smokeless tobacco: Never Used         OPHTHALMIC EXAM:  Base Eye Exam    Visual Acuity (ETDRS)      Right Left   Dist [place] 20/20 -1 20/20       Tonometry (Tonopen, 10:32 AM)      Right Left   Pressure 17 17       Pupils      Pupils Dark Light Shape React APD   Right PERRL 3 3 Round Brisk None   Left PERRL 3 3 Round Brisk None       Visual Fields (Counting fingers)      Left Right    Full Full       Extraocular Movement      Right Left    Full Full       Neuro/Psych    Oriented x3: Yes       Dilation    Both eyes: 1.0% Mydriacyl, 2.5% Phenylephrine @ 10:32 AM        Slit Lamp and Fundus Exam    External Exam      Right Left   External Normal Normal       Slit Lamp Exam      Right Left   Lids/Lashes Normal Normal   Conjunctiva/Sclera White and quiet White and quiet   Cornea Clear Clear   Anterior Chamber Deep and quiet Deep and quiet  Iris Round and reactive Round and reactive   Lens Centered posterior chamber intraocular lens Centered posterior chamber intraocular lens   Anterior Vitreous Normal Normal       Fundus Exam      Right Left   Posterior Vitreous Central vitreous floaters, Posterior vitreous detachment Clear, avitric   Disc Normal Normal   C/D Ratio 0.15 0.15   Macula Normal Normal   Vessels Normal Normal   Periphery Normal, no holes or tears Normal, no holes or tears          IMAGING AND PROCEDURES  Imaging and Procedures for 09/24/20  OCT, Retina - OU - Both Eyes       Right Eye Quality was good. Scan locations included subfoveal. Central Foveal Thickness: 266. Progression has been stable. Findings include normal foveal contour.   Left Eye Quality was good. Central Foveal Thickness: 275. Progression has been stable. Findings include normal foveal contour.   Notes Incidental note posterior vitreous detachment right eye       Color Fundus Photography Optos - OU - Both Eyes       Right  Eye Progression has been stable. Disc findings include normal observations. Macula : normal observations. Vessels : normal observations. Periphery : normal observations.   Left Eye Progression has been stable. Macula : normal observations. Vessels : normal observations. Periphery : normal observations.   Notes Clear media OU.  Incidental posterior vitreous detachment OD observe                ASSESSMENT/PLAN:  History of vitrectomy No new findings OS  Posterior vitreous detachment, right eye Minor vitreous debris now visually that but not hampering vision  Vitreous membranes and strands, left eye No new findings left      ICD-10-CM   1. Posterior vitreous detachment, right eye  H43.811 OCT, Retina - OU - Both Eyes    Color Fundus Photography Optos - OU - Both Eyes  2. History of vitrectomy  Z98.890   3. Vitreous membranes and strands, left eye  H43.312     1.  OS, looks great post vitrectomy for vitreous membranes and strands that were visual debilitating, 2019.  No new findings.  Right eye with new onset visual symptoms, not debilitating to the patients experience in life thus will recommend observation. 2.  3.  Ophthalmic Meds Ordered this visit:  No orders of the defined types were placed in this encounter.      Return in about 3 years (around 09/25/2023) for DILATE OU, COLOR FP, OCT.  There are no Patient Instructions on file for this visit.   Explained the diagnoses, plan, and follow up with the patient and they expressed understanding.  Patient expressed understanding of the importance of proper follow up care.   Clent Demark Rankin M.D. Diseases & Surgery of the Retina and Vitreous Retina & Diabetic [place] 09/24/20     Abbreviations: M myopia (nearsighted); A astigmatism; H hyperopia (farsighted); P presbyopia; Mrx spectacle prescription;  CTL contact lenses; OD right eye; OS left eye; OU both eyes  XT exotropia; ET esotropia; PEK punctate  epithelial keratitis; PEE punctate epithelial erosions; DES dry eye syndrome; MGD meibomian gland dysfunction; ATs artificial tears; PFAT's preservative free artificial tears; [place] nuclear sclerotic cataract; PSC posterior subcapsular cataract; ERM epi-retinal membrane; PVD posterior vitreous detachment; RD retinal detachment; DM diabetes mellitus; DR diabetic retinopathy; NPDR non-proliferative diabetic retinopathy; PDR proliferative diabetic retinopathy; CSME clinically significant macular edema; DME diabetic macular edema; dbh dot  blot hemorrhages; CWS cotton wool spot; POAG primary open angle glaucoma; C/D cup-to-disc ratio; HVF humphrey visual field; GVF goldmann visual field; OCT optical coherence tomography; IOP intraocular pressure; BRVO Branch retinal vein occlusion; CRVO central retinal vein occlusion; CRAO central retinal artery occlusion; BRAO branch retinal artery occlusion; RT retinal tear; SB scleral buckle; PPV pars plana vitrectomy; VH Vitreous hemorrhage; PRP panretinal laser photocoagulation; IVK intravitreal kenalog; VMT vitreomacular traction; MH Macular hole;  NVD neovascularization of the disc; NVE neovascularization elsewhere; AREDS age related eye disease study; ARMD age related macular degeneration; POAG primary open angle glaucoma; EBMD epithelial/anterior basement membrane dystrophy; ACIOL anterior chamber intraocular lens; IOL intraocular lens; PCIOL posterior chamber intraocular lens; Phaco/IOL phacoemulsification with intraocular lens placement; [place] photorefractive keratectomy; LASIK laser assisted in situ keratomileusis; HTN hypertension; DM diabetes mellitus; COPD chronic obstructive pulmonary disease

## 2020-09-24 NOTE — Assessment & Plan Note (Signed)
No new findings left

## 2020-09-24 NOTE — Assessment & Plan Note (Signed)
Minor vitreous debris now visually that but not hampering vision

## 2020-09-24 NOTE — Assessment & Plan Note (Signed)
No new findings OS

## 2020-10-22 ENCOUNTER — Encounter (INDEPENDENT_AMBULATORY_CARE_PROVIDER_SITE_OTHER): Payer: Self-pay

## 2022-02-25 ENCOUNTER — Encounter (INDEPENDENT_AMBULATORY_CARE_PROVIDER_SITE_OTHER): Payer: Medicare Other | Admitting: Ophthalmology

## 2022-02-25 ENCOUNTER — Encounter (INDEPENDENT_AMBULATORY_CARE_PROVIDER_SITE_OTHER): Payer: Self-pay | Admitting: Ophthalmology

## 2022-02-25 ENCOUNTER — Ambulatory Visit (INDEPENDENT_AMBULATORY_CARE_PROVIDER_SITE_OTHER): Payer: Medicare Other | Admitting: Ophthalmology

## 2022-02-25 DIAGNOSIS — H43811 Vitreous degeneration, right eye: Secondary | ICD-10-CM

## 2022-02-25 DIAGNOSIS — H43311 Vitreous membranes and strands, right eye: Secondary | ICD-10-CM

## 2022-02-25 NOTE — Progress Notes (Signed)
02/25/2022     CHIEF COMPLAINT Patient presents for  Chief Complaint  Patient presents with   Posterior Vitreous Detachment      HISTORY OF PRESENT ILLNESS: Maria Beck is a 76 y.o. female who presents to the clinic today for:   HPI   1 YR FU OU OCT- Pt has some discomfort OS  Pt stated, "I have this pressure in my left eye and it feels dry. Sometimes my eyes feel blurry and I would blink a couple of times to readjust my eyesight. It started about 2 months ago and it feels like theres sand in there or something. I see Dr. Lucita Ferrara in a couple of months to check up since he performed my cataract sx in my right eye about a year ago." Pt reports vision has remained stable within the past year.   Last edited by Silvestre Moment on 02/25/2022  2:14 PM.      Referring physician: Vevelyn Royals, MD [place],  [place] [address]  HISTORICAL INFORMATION:   Selected notes from the [facility]: No current outpatient medications on file. (Ophthalmic Drugs)   No current facility-administered medications for this visit. (Ophthalmic Drugs)   No current outpatient medications on file. (Other)   No current facility-administered medications for this visit. (Other)      REVIEW OF SYSTEMS: ROS   Negative for: Constitutional, Gastrointestinal, Neurological, Skin, Genitourinary, Musculoskeletal, HENT, Endocrine, Cardiovascular, Eyes, Respiratory, Psychiatric, Allergic/Imm, Heme/Lymph Last edited by Silvestre Moment on 02/25/2022  2:14 PM.       ALLERGIES No Known Allergies  PAST MEDICAL HISTORY Past Medical History:  Diagnosis Date   Vitreous membranes and strands, left eye 09/24/2020   Resected October 2019 with improved acuity   History reviewed. No pertinent surgical history.  FAMILY HISTORY History reviewed. No pertinent family history.  SOCIAL HISTORY Social History   Tobacco Use   Smoking status: Never   Smokeless  tobacco: Never         OPHTHALMIC EXAM:  Base Eye Exam     Visual Acuity (ETDRS)       Right Left   Dist [place] 20/20 20/20         Tonometry (Tonopen, 2:17 PM)       Right Left   Pressure 14 15         Pupils       Pupils Dark Light Shape React APD   Right PERRL 3 3 Round Minimal None   Left PERRL 3 3 Round Minimal None         Visual Fields       Left Right    Full Full         Extraocular Movement       Right Left    Full Full         Neuro/Psych     Oriented x3: Yes         Dilation     Both eyes: 1.0% Mydriacyl, 2.5% Phenylephrine @ 2:17 PM           Slit Lamp and Fundus Exam     External Exam       Right Left   External Normal Normal         Slit Lamp Exam       Right Left   Lids/Lashes Normal Normal   Conjunctiva/Sclera White and quiet White and quiet   Cornea  Clear Clear   Anterior Chamber Deep and quiet Deep and quiet   Iris Round and reactive Round and reactive   Lens Centered posterior chamber intraocular lens Centered posterior chamber intraocular lens   Anterior Vitreous Normal Normal         Fundus Exam       Right Left   Posterior Vitreous Central vitreous floaters, Posterior vitreous detachment Clear, avitric   Disc Normal Normal   C/D Ratio 0.15 0.15   Macula Normal Normal   Vessels Normal Normal   Periphery Normal, no holes or tears Normal, no holes or tears            IMAGING AND PROCEDURES  Imaging and Procedures for 02/25/22  OCT, Retina - OU - Both Eyes       Right Eye Quality was good. Scan locations included subfoveal. Central Foveal Thickness: 260. Progression has been stable. Findings include normal foveal contour.   Left Eye Quality was good. Central Foveal Thickness: 274. Progression has been stable. Findings include normal foveal contour.   Notes Incidental note posterior vitreous detachment right eye      Color Fundus Photography Optos - OU - Both Eyes       Right  Eye Progression has been stable. Disc findings include normal observations. Macula : normal observations. Vessels : normal observations. Periphery : normal observations.   Left Eye Progression has been stable. Macula : normal observations. Vessels : normal observations. Periphery : normal observations.   Notes Clear media OU.  Incidental posterior vitreous detachment OD observe                ASSESSMENT/PLAN:  Posterior vitreous detachment, right eye  The nature of posterior vitreous detachment was discussed with the patient as well as its physiology, its age prevalence, and its possible implication regarding retinal breaks and detachment.  An informational brochure was offered to the patient.  All the patient's questions were answered.  The patient was asked to return if new or different flashes or floaters develops.   Patient was instructed to contact office immediately if any new changes were noticed. I explained to the patient that vitreous inside the eye is similar to jello inside a bowl. As the jello melts it can start to pull away from the bowl, similarly the vitreous throughout our lives can begin to pull away from the retina. That process is called a posterior vitreous detachment. In some cases, the vitreous can tug hard enough on the retina to form a retinal tear. I discussed with the patient the signs and symptoms of a retinal detachment.  Do not rub the eye.    Vitreous membranes and strands, right Your symptoms at this time OD.  Follow-up Dr. Lucita Ferrara scheduled     ICD-10-CM   1. Posterior vitreous detachment, right eye  H43.811 OCT, Retina - OU - Both Eyes    Color Fundus Photography Optos - OU - Both Eyes    2. Vitreous membranes and strands, right  H43.311       1.  OD minor vitreous strands membranes stable not traveling patient at this time  2.  OS history of vitrectomy for vitreous membranes and strands patient very delighted to no longer have the  difficulty of seeing through the blurring effect of that issue  3.  Ophthalmic Meds Ordered this visit:  No orders of the defined types were placed in this encounter.      No follow-ups on file.  There are no  Patient Instructions on file for this visit.   Explained the diagnoses, plan, and follow up with the patient and they expressed understanding.  Patient expressed understanding of the importance of proper follow up care.   Clent Demark Deshawn Witty M.D. Diseases & Surgery of the Retina and Vitreous Retina & Diabetic [place] 02/25/22     Abbreviations: M myopia (nearsighted); A astigmatism; H hyperopia (farsighted); P presbyopia; Mrx spectacle prescription;  CTL contact lenses; OD right eye; OS left eye; OU both eyes  XT exotropia; ET esotropia; PEK punctate epithelial keratitis; PEE punctate epithelial erosions; DES dry eye syndrome; MGD meibomian gland dysfunction; ATs artificial tears; PFAT's preservative free artificial tears; [place] nuclear sclerotic cataract; PSC posterior subcapsular cataract; ERM epi-retinal membrane; PVD posterior vitreous detachment; RD retinal detachment; DM diabetes mellitus; DR diabetic retinopathy; NPDR non-proliferative diabetic retinopathy; PDR proliferative diabetic retinopathy; CSME clinically significant macular edema; DME diabetic macular edema; dbh dot blot hemorrhages; CWS cotton wool spot; POAG primary open angle glaucoma; C/D cup-to-disc ratio; HVF humphrey visual field; GVF goldmann visual field; OCT optical coherence tomography; IOP intraocular pressure; BRVO Branch retinal vein occlusion; CRVO central retinal vein occlusion; CRAO central retinal artery occlusion; BRAO branch retinal artery occlusion; RT retinal tear; SB scleral buckle; PPV pars plana vitrectomy; VH Vitreous hemorrhage; PRP panretinal laser photocoagulation; IVK intravitreal kenalog; VMT vitreomacular traction; MH Macular hole;  NVD neovascularization of the disc; NVE neovascularization  elsewhere; AREDS age related eye disease study; ARMD age related macular degeneration; POAG primary open angle glaucoma; EBMD epithelial/anterior basement membrane dystrophy; ACIOL anterior chamber intraocular lens; IOL intraocular lens; PCIOL posterior chamber intraocular lens; Phaco/IOL phacoemulsification with intraocular lens placement; [place] photorefractive keratectomy; LASIK laser assisted in situ keratomileusis; HTN hypertension; DM diabetes mellitus; COPD chronic obstructive pulmonary disease

## 2022-02-25 NOTE — Assessment & Plan Note (Signed)

## 2022-02-25 NOTE — Assessment & Plan Note (Signed)
Your symptoms at this time OD.  Follow-up Dr. Lucita Ferrara scheduled

## 2023-02-28 ENCOUNTER — Encounter (INDEPENDENT_AMBULATORY_CARE_PROVIDER_SITE_OTHER): Payer: Medicare Other | Admitting: Ophthalmology
